# Patient Record
Sex: Male | Born: 1985 | Race: White | Hispanic: No | Marital: Single | Smoking: Current every day smoker
Health system: Southern US, Community
[De-identification: ages and names within clinical notes are randomized; demographics above are authoritative.]

---

## 2022-01-13 ENCOUNTER — Encounter (HOSPITAL_COMMUNITY): Payer: Self-pay

## 2022-01-13 ENCOUNTER — Other Ambulatory Visit: Payer: Self-pay

## 2022-01-13 ENCOUNTER — Emergency Department (HOSPITAL_COMMUNITY): Payer: Self-pay

## 2022-01-13 ENCOUNTER — Emergency Department (HOSPITAL_COMMUNITY)
Admission: EM | Admit: 2022-01-13 | Discharge: 2022-01-13 | Disposition: A | Discharge status: Home / Self Care | Payer: Self-pay | Attending: Emergency Medicine | Admitting: Emergency Medicine

## 2022-01-13 DIAGNOSIS — M25562 Pain in left knee: Secondary | ICD-10-CM | POA: Insufficient documentation

## 2022-01-13 MED ORDER — ACETAMINOPHEN 325 MG PO TABS
650.0000 mg | ORAL_TABLET | Freq: Once | ORAL | Status: AC
  Administered 2022-01-13: 650 mg via ORAL
  Filled 2022-01-13: qty 2

## 2022-01-13 NOTE — ED Triage Notes (Signed)
Pt reports left knee pain and swelling. Pt denies injury but reports he has been walking more than often. ?

## 2022-01-13 NOTE — ED Provider Triage Note (Signed)
Emergency Medicine Provider Triage Evaluation Note ? ?Keith Kirby , a 36 y.o. male  was evaluated in triage.  Pt complains of left knee pain for the past several days. 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 Last IV use 3/15. Reports worsening pain, decreased ROM, feels hot to touch. Thinks he may have strained it walking around a lot. No systemic fever, chills. No previous injury or surgery. ? ?Review of Systems  ?Positive: Knee pain, swelling ?Negative: Fever, chills ? ?Physical Exam  ?BP 103/79 (BP Location: Right Arm)   Pulse 87   Temp 98.6 ?F (37 ?C) (Oral)   Resp 18   SpO2 95%  ?Gen:   Awake, no distress   ?Resp:  Normal effort  ?MSK:   Moves extremities without difficulty  ?Other:  Some TTP left knee with minimal to no effusion. Decreased ROM 2/2 pain. No clear septic joint or cellulitis, but some warmth to touch. ? ?Medical Decision Making  ?Medically screening exam initiated at 5:25 PM.  Appropriate orders placed.  Keith Kirby was informed that the remainder of the evaluation will be completed by another provider, this initial triage assessment does not replace that evaluation, and the importance of remaining in the ED until their evaluation is complete. ? ?Workup initiated ?  ?Olene Floss, PA-C ?01/13/22 1727 ? ?

## 2022-01-13 NOTE — Discharge Instructions (Signed)
Please use Tylenol or ibuprofen for pain.  You may use 600 mg ibuprofen every 6 hours or 1000 mg of Tylenol every 6 hours.  You may choose to alternate between the 2.  This would be most effective.  Not to exceed 4 g of Tylenol within 24 hours.  Not to exceed 3200 mg ibuprofen 24 hours.  
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
## 2022-06-28 ENCOUNTER — Emergency Department (HOSPITAL_COMMUNITY): Payer: Self-pay

## 2022-06-28 ENCOUNTER — Emergency Department (HOSPITAL_COMMUNITY)
Admission: EM | Admit: 2022-06-28 | Discharge: 2022-06-28 | Disposition: A | Discharge status: Home / Self Care | Payer: Self-pay | Attending: Emergency Medicine | Admitting: Emergency Medicine

## 2022-06-28 ENCOUNTER — Encounter (HOSPITAL_COMMUNITY): Payer: Self-pay | Admitting: Emergency Medicine

## 2022-06-28 ENCOUNTER — Other Ambulatory Visit: Payer: Self-pay

## 2022-06-28 DIAGNOSIS — T402X1A Poisoning by other opioids, accidental (unintentional), initial encounter: Secondary | ICD-10-CM | POA: Insufficient documentation

## 2022-06-28 DIAGNOSIS — T40601A Poisoning by unspecified narcotics, accidental (unintentional), initial encounter: Secondary | ICD-10-CM

## 2022-06-28 DIAGNOSIS — R4182 Altered mental status, unspecified: Secondary | ICD-10-CM | POA: Insufficient documentation

## 2022-06-28 LAB — COMPREHENSIVE METABOLIC PANEL
ALT: 31 U/L (ref 0–44)
AST: 38 U/L (ref 15–41)
Albumin: 4.2 g/dL (ref 3.5–5.0)
Alkaline Phosphatase: 77 U/L (ref 38–126)
Anion gap: 6 (ref 5–15)
BUN: 17 mg/dL (ref 6–20)
CO2: 28 mmol/L (ref 22–32)
Calcium: 8.5 mg/dL — ABNORMAL LOW (ref 8.9–10.3)
Chloride: 105 mmol/L (ref 98–111)
Creatinine, Ser: 1.18 mg/dL (ref 0.61–1.24)
GFR, Estimated: >60 mL/min (ref >60)
Glucose, Bld: 113 mg/dL — ABNORMAL HIGH (ref 70–99)
Potassium: 4.5 mmol/L (ref 3.5–5.1)
Sodium: 139 mmol/L (ref 135–145)
Total Bilirubin: 0.5 mg/dL (ref 0.3–1.2)
Total Protein: 7.3 g/dL (ref 6.5–8.1)

## 2022-06-28 LAB — CBC WITH DIFFERENTIAL/PLATELET
Abs Immature Granulocytes: 0.1 10*3/uL — ABNORMAL HIGH (ref 0.00–0.07)
Basophils Absolute: 0 10*3/uL (ref 0.0–0.1)
Basophils Relative: 0 %
Eosinophils Absolute: 0 10*3/uL (ref 0.0–0.5)
Eosinophils Relative: 0 %
HCT: 46.2 % (ref 39.0–52.0)
Hemoglobin: 15.5 g/dL (ref 13.0–17.0)
Immature Granulocytes: 1 %
Lymphocytes Relative: 6 %
Lymphs Abs: 0.8 10*3/uL (ref 0.7–4.0)
MCH: 30.8 pg (ref 26.0–34.0)
MCHC: 33.5 g/dL (ref 30.0–36.0)
MCV: 91.7 fL (ref 80.0–100.0)
Monocytes Absolute: 1.1 10*3/uL — ABNORMAL HIGH (ref 0.1–1.0)
Monocytes Relative: 8 %
Neutro Abs: 10.5 10*3/uL — ABNORMAL HIGH (ref 1.7–7.7)
Neutrophils Relative %: 85 %
Platelets: 229 10*3/uL (ref 150–400)
RBC: 5.04 MIL/uL (ref 4.22–5.81)
RDW: 12.4 % (ref 11.5–15.5)
WBC: 12.6 10*3/uL — ABNORMAL HIGH (ref 4.0–10.5)
nRBC: 0 % (ref 0.0–0.2)

## 2022-06-28 LAB — RAPID URINE DRUG SCREEN, HOSP PERFORMED
Amphetamines: POSITIVE — AB
Barbiturates: NOT DETECTED
Benzodiazepines: NOT DETECTED
Cocaine: POSITIVE — AB
Opiates: NOT DETECTED
Tetrahydrocannabinol: POSITIVE — AB

## 2022-06-28 LAB — ETHANOL: Alcohol, Ethyl (B): <10 mg/dL (ref <10)

## 2022-06-28 MED ORDER — NALOXONE HCL 2 MG/2ML IJ SOSY
1.0000 mg | PREFILLED_SYRINGE | INTRAMUSCULAR | Status: DC | PRN
  Administered 2022-06-28 (×2): 1 mg via INTRAVENOUS
  Filled 2022-06-28: qty 2

## 2022-06-28 MED ORDER — NALOXONE HCL 4 MG/0.1ML NA LIQD: NASAL | 1 refills | Status: AC

## 2022-06-28 MED ORDER — SODIUM CHLORIDE 0.9 % IV BOLUS
1000.0000 mL | Freq: Once | INTRAVENOUS | Status: AC
  Administered 2022-06-28: 1000 mL via INTRAVENOUS

## 2022-06-28 NOTE — ED Notes (Signed)
Nurse tech and RN went to pt room to perform a foley when he woke up stated "The hell you won't stick that in me." Urinal given to pt. Still awaiting sample
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
## 2022-06-28 NOTE — ED Notes (Signed)
Patient awake and talking. Meal given. Ride called for them to come pick up patient. JRPRN
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
## 2022-06-28 NOTE — ED Provider Notes (Signed)
  Physical Exam  BP 115/86   Pulse 63   Temp 97.8 F (36.6 C) (Oral)   Resp 18   SpO2 100%   Physical Exam Vitals and nursing note reviewed.  Constitutional:      General: He is not in acute distress.    Appearance: Normal appearance.  HENT:     Mouth/Throat:     Mouth: Mucous membranes are moist.  Eyes:     Conjunctiva/sclera: Conjunctivae normal.  Cardiovascular:     Rate and Rhythm: Normal rate and regular rhythm.  Pulmonary:     Effort: Pulmonary effort is normal. No respiratory distress.     Breath sounds: Normal breath sounds.  Abdominal:     General: Abdomen is flat.     Palpations: Abdomen is soft.     Tenderness: There is no abdominal tenderness.  Musculoskeletal:     Right lower leg: No edema.     Left lower leg: No edema.  Skin:    General: Skin is warm and dry.     Capillary Refill: Capillary refill takes less than 2 seconds.  Neurological:     Mental Status: He is alert and oriented to person, place, and time. Mental status is at baseline.  Psychiatric:        Mood and Affect: Mood normal.        Behavior: Behavior normal.     Procedures  Procedures  ED Course / MDM   Clinical Course as of 06/28/22 2040  Tue Jun 28, 2022  2031 Received signout on patient pending reassessment after opioid overdose.  Last received Narcan over 4 hours ago.  Patient awake, alert, oriented. Will discharge patient to home. All questions answered. Patient comfortable with plan of discharge. Return precautions discussed with patient and specified on the after visit summary.  [WS]    Clinical Course User Index [WS] Lonell Grandchild, MD   Medical Decision Making Amount and/or Complexity of Data Reviewed Labs: ordered. Radiology: ordered. ECG/medicine tests: ordered.  Risk Prescription drug management.        Lonell Grandchild, MD 06/28/22 2040

## 2022-06-28 NOTE — ED Notes (Signed)
Unable to obtain temp at this time. Will try again

## 2022-07-31 ENCOUNTER — Inpatient Hospital Stay (HOSPITAL_COMMUNITY)
Admission: EM | Admit: 2022-07-31 | Discharge: 2022-08-01 | DRG: 917 | Discharge status: Against Medical Advice | Payer: Self-pay | Attending: Internal Medicine | Admitting: Internal Medicine

## 2022-07-31 ENCOUNTER — Emergency Department (HOSPITAL_COMMUNITY): Payer: Self-pay

## 2022-07-31 ENCOUNTER — Encounter (HOSPITAL_COMMUNITY): Payer: Self-pay | Admitting: Emergency Medicine

## 2022-07-31 DIAGNOSIS — Y92524 Gas station as the place of occurrence of the external cause: Secondary | ICD-10-CM

## 2022-07-31 DIAGNOSIS — F1490 Cocaine use, unspecified, uncomplicated: Secondary | ICD-10-CM | POA: Diagnosis present

## 2022-07-31 DIAGNOSIS — G928 Other toxic encephalopathy: Secondary | ICD-10-CM | POA: Diagnosis present

## 2022-07-31 DIAGNOSIS — T40601A Poisoning by unspecified narcotics, accidental (unintentional), initial encounter: Principal | ICD-10-CM | POA: Diagnosis present

## 2022-07-31 DIAGNOSIS — Z5329 Procedure and treatment not carried out because of patient's decision for other reasons: Secondary | ICD-10-CM | POA: Diagnosis present

## 2022-07-31 LAB — COMPREHENSIVE METABOLIC PANEL
ALT: 26 U/L (ref 0–44)
AST: 35 U/L (ref 15–41)
Albumin: 4.3 g/dL (ref 3.5–5.0)
Alkaline Phosphatase: 86 U/L (ref 38–126)
Anion gap: 6 (ref 5–15)
BUN: 22 mg/dL — ABNORMAL HIGH (ref 6–20)
CO2: 28 mmol/L (ref 22–32)
Calcium: 8.6 mg/dL — ABNORMAL LOW (ref 8.9–10.3)
Chloride: 106 mmol/L (ref 98–111)
Creatinine, Ser: 1.09 mg/dL (ref 0.61–1.24)
GFR, Estimated: >60 mL/min (ref >60)
Glucose, Bld: 130 mg/dL — ABNORMAL HIGH (ref 70–99)
Potassium: 4.4 mmol/L (ref 3.5–5.1)
Sodium: 140 mmol/L (ref 135–145)
Total Bilirubin: 0.8 mg/dL (ref 0.3–1.2)
Total Protein: 7.3 g/dL (ref 6.5–8.1)

## 2022-07-31 LAB — CBC WITH DIFFERENTIAL/PLATELET
Abs Immature Granulocytes: 0.03 10*3/uL (ref 0.00–0.07)
Basophils Absolute: 0.1 10*3/uL (ref 0.0–0.1)
Basophils Relative: 1 %
Eosinophils Absolute: 0.1 10*3/uL (ref 0.0–0.5)
Eosinophils Relative: 2 %
HCT: 44.6 % (ref 39.0–52.0)
Hemoglobin: 15.1 g/dL (ref 13.0–17.0)
Immature Granulocytes: 1 %
Lymphocytes Relative: 27 %
Lymphs Abs: 1.6 10*3/uL (ref 0.7–4.0)
MCH: 30.6 pg (ref 26.0–34.0)
MCHC: 33.9 g/dL (ref 30.0–36.0)
MCV: 90.5 fL (ref 80.0–100.0)
Monocytes Absolute: 0.4 10*3/uL (ref 0.1–1.0)
Monocytes Relative: 7 %
Neutro Abs: 3.8 10*3/uL (ref 1.7–7.7)
Neutrophils Relative %: 62 %
Platelets: 220 10*3/uL (ref 150–400)
RBC: 4.93 MIL/uL (ref 4.22–5.81)
RDW: 11.9 % (ref 11.5–15.5)
WBC: 6 10*3/uL (ref 4.0–10.5)
nRBC: 0 % (ref 0.0–0.2)

## 2022-07-31 LAB — ETHANOL: Alcohol, Ethyl (B): <10 mg/dL (ref <10)

## 2022-07-31 LAB — SALICYLATE LEVEL: Salicylate Lvl: <7 mg/dL — ABNORMAL LOW (ref 7.0–30.0)

## 2022-07-31 LAB — ACETAMINOPHEN LEVEL: Acetaminophen (Tylenol), Serum: <10 ug/mL — ABNORMAL LOW (ref 10–30)

## 2022-07-31 LAB — CBG MONITORING, ED: Glucose-Capillary: 123 mg/dL — ABNORMAL HIGH (ref 70–99)

## 2022-07-31 MED ORDER — NALOXONE HCL 2 MG/2ML IJ SOSY
1.0000 mg | PREFILLED_SYRINGE | Freq: Once | INTRAMUSCULAR | Status: AC
  Administered 2022-07-31: 1 mg via INTRAVENOUS
  Filled 2022-07-31: qty 2

## 2022-07-31 MED ORDER — NALOXONE HCL 2 MG/2ML IJ SOSY
2.0000 mg | PREFILLED_SYRINGE | Freq: Once | INTRAMUSCULAR | Status: AC
  Administered 2022-07-31: 2 mg via INTRAVENOUS
  Filled 2022-07-31: qty 2

## 2022-07-31 MED ORDER — SODIUM CHLORIDE 0.9 % IV BOLUS
1000.0000 mL | Freq: Once | INTRAVENOUS | Status: AC
  Administered 2022-07-31: 1000 mL via INTRAVENOUS

## 2022-07-31 MED ORDER — NALOXONE HCL 0.4 MG/ML IJ SOLN
INTRAMUSCULAR | Status: AC
  Filled 2022-07-31: qty 1

## 2022-07-31 MED ORDER — NALOXONE HCL 2 MG/2ML IJ SOSY
2.0000 mg | PREFILLED_SYRINGE | Freq: Once | INTRAMUSCULAR | Status: AC
  Administered 2022-08-01: 2 mg via INTRAVENOUS
  Filled 2022-07-31: qty 2

## 2022-07-31 MED ORDER — NALOXONE HCL 4 MG/10ML IJ SOLN
3.0000 mg/h | INTRAVENOUS | Status: DC
  Administered 2022-08-01: 3 mg/h via INTRAVENOUS
  Filled 2022-07-31 (×3): qty 10

## 2022-07-31 MED ORDER — NALOXONE HCL 0.4 MG/ML IJ SOLN
0.4000 mg | Freq: Once | INTRAMUSCULAR | Status: AC
  Administered 2022-07-31: 0.4 mg via INTRAVENOUS
  Filled 2022-07-31: qty 1

## 2022-07-31 MED ORDER — ONDANSETRON HCL 4 MG/2ML IJ SOLN
4.0000 mg | Freq: Once | INTRAMUSCULAR | Status: AC
  Administered 2022-07-31: 4 mg via INTRAVENOUS
  Filled 2022-07-31: qty 2
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
## 2022-07-31 NOTE — ED Notes (Signed)
Pt refusing all care from staff. End [place] CO2 [place] applied to nose and pt removed it saying "I don't need any care, I've been refusing and ya'll continue to bother me" This RN will continue to wake pt for increased O2 sats adm vital signs

## 2022-07-31 NOTE — H&P (Signed)
NAME:  Keith Kirby, MRN:  [id number], DOB:  1986-02-05, LOS: 0 ADMISSION DATE:  07/31/2022, CONSULTATION DATE:  10/8 REFERRING MD:  Dr. Pearline Cables, CHIEF COMPLAINT:  overdose   History of Present Illness:  Patient is a 36 year old male with pertinent PMH of polysubstance abuse presents to [facility] ED on 10/8 for overdose.  Patient recently admitted to [facility] ED in September with positive UDS with cocaine, amphetamines, THC.  Per EMS patient was found in a parking lot on 10/8 at [employer].  Found patient unresponsive and apneic.  Required BVM and Narcan with improvement in mental status.  Transferred to [facility] ED.  Upon arrival to [facility] ED patient more awake and responsive.  Patient states that he did take THC and cocaine.  Denies opiate use but states that he met his symptoms and fentanyl.  Denies alcohol use.  Refuses a urine sample for UDS.  Vital stable with decreased respiratory rate.  Requiring multiple doses of Narcan and was started on Narcan drip.  PCCM consulted for ICU admission.   Pertinent ED labs/imaging: Ethanol, acetaminophen, salicylate level WNL.  CT head and CXR with no acute abnormality.  Upon eval pt is arousable easily and requesting to leave. Stating he will pull out IV and just leave. I encouraged him to remain here and we can begin wean of gtt safely to ensure resp status remains stable.  His only complaint is the fact that he is in the hospital and wants to get home to his "lady" in addition to nausea and wanting to drink. He denies opioid use and states he doesn't need our help and will be pulling out his iv's because he's getting sick from the medications we are giving.   Pertinent  Medical History  Polysubstance abuse  Significant Hospital Events: Including procedures, antibiotic start and stop dates in addition to other pertinent events   10/8 overdose started on Narcan drip; PCCM consulted  Interim History / Subjective:  See above  Objective   Blood pressure  129/87, pulse 80, temperature 98.7 F (37.1 C), temperature source Oral, resp. rate (!) 8, SpO2 93 %.        Intake/Output Summary (Last 24 hours) at 07/31/2022 2345 Last data filed at 07/31/2022 1932 Gross per 24 hour  Intake 466.2 ml  Output --  Net 466.2 ml   There were no vitals filed for this visit.  Examination: General: arousable, aaox3, nad resting comfortably HENT: ncat, eomi, perrla, mmmp, tongue ring in place Lungs: ctab Cardiovascular: rrr Abdomen: soft nt/nd bs+ Extremities: no c/c/e Neuro: sleepy but arousable, no focal deficits.  GU: deferred.   Resolved Hospital Problem list     Assessment & Plan:   OD- admits to taking cocaine, THC, possible fentanyl; currently refusing UDS Polysubstance abuse Acute encephalopathy due to above -Ethanol, acetaminophen, salicylate level WNL -CT head with no acute abnormality P: -Admit to ICU with continuous telemetry monitoring -Continue Narcan drip but begin wean at this time with neuro exam stable -Send UDS when able -prn zofran for nausea -limit sedating meds -TOC consult for cessation counseling when appropriate -Closely monitor respiratory status; currently on room air protecting airway   Best Practice (right click and "Reselect all SmartList Selections" daily)   Diet/type: NPO DVT prophylaxis: SCD GI prophylaxis: N/A Lines: N/A Foley:  N/A Code Status:  full code Last date of multidisciplinary goals of care discussion [pending more calm discussion, will place full order]  Labs   CBC: Recent Labs  Lab 07/31/22  1844  WBC 6.0  NEUTROABS 3.8  HGB 15.1  HCT 44.6  MCV 90.5  PLT 154    Basic Metabolic Panel: Recent Labs  Lab 07/31/22 1844  NA 140  K 4.4  CL 106  CO2 28  GLUCOSE 130*  BUN 22*  CREATININE 1.09  CALCIUM 8.6*   GFR: CrCl cannot be calculated (Unknown ideal weight.). Recent Labs  Lab 07/31/22 1844  WBC 6.0    Liver Function Tests: Recent Labs  Lab 07/31/22 1844  AST 35   ALT 26  ALKPHOS 86  BILITOT 0.8  PROT 7.3  ALBUMIN 4.3   No results for input(s): "LIPASE", "AMYLASE" in the last 168 hours. No results for input(s): "AMMONIA" in the last 168 hours.  ABG No results found for: "PHART", "PCO2ART", "PO2ART", "HCO3", "TCO2", "ACIDBASEDEF", "O2SAT"   Coagulation Profile: No results for input(s): "INR", "PROTIME" in the last 168 hours.  Cardiac Enzymes: No results for input(s): "CKTOTAL", "CKMB", "CKMBINDEX", "TROPONINI" in the last 168 hours.  HbA1C: No results found for: "HGBA1C"  CBG: Recent Labs  Lab 07/31/22 1912  GLUCAP 123*    Review of Systems:   As per HPI  Past Medical History:  He,  has no past medical history on file.   Surgical History:  History reviewed. No pertinent surgical history.   Social History:      Family History:  His family history is not on file.   Allergies No Known Allergies   Home Medications  Prior to Admission medications   Medication Sig Start Date End Date Taking? Authorizing Provider  naloxone [facility]) nasal spray 4 mg/0.1 mL Use as needed in the nose for opioid overdose 06/28/22  Yes Cristie Hem, MD     Critical care time: 82mn excluding procedures.

## 2022-07-31 NOTE — ED Provider Notes (Signed)
[facility] DEPT Provider Note   CSN: [id number] Arrival date & time: 07/31/22  1830     History  Chief Complaint  Patient presents with   Drug Overdose    Keith Kirby is a 36 y.o. male.  Patient as above with significant medical history as below, including n polysubstance abuse who presents to the ED with complaint of possible overdose.  Per EMS she was found in the parking lot at the [place] [employer] station, apneic, pale, unresponsive, BVM for 4 to 5 minutes.  Given 2 mg Narcan IV with improvement to respiratory mental status.  Patient denies intentional harm.  Reports he was using cocaine, denies any opiate use.  But possible someone may have slipped him something, maybe fentanyl.  Thinks he may have had some THC earlier as well. No alcohol use.  Reports that he is thirsty, no nausea or vomiting, no dyspnea.  Reports she has been riding his bicycle all day and he is exhausted, he later then denies any sort of overdose or drug use. Reports last tetanus shot was <1 yr ago. No SI or HI      History reviewed. No pertinent past medical history.  History reviewed. No pertinent surgical history.   The history is provided by the patient. No language interpreter was used.  Drug Overdose       Home Medications Prior to Admission medications   Medication Sig Start Date End Date Taking? Authorizing Provider  naloxone [facility]) nasal spray 4 mg/0.1 mL Use as needed in the nose for opioid overdose 06/28/22  Yes Scheving, Livingston Diones, MD      Allergies    Patient has no known allergies.    Review of Systems   Review of Systems  Unable to perform ROS: Mental status change    Physical Exam Updated Vital Signs BP 129/87   Pulse 80   Temp 98.7 F (37.1 C) (Oral)   Resp (!) 8   SpO2 93%  Physical Exam Vitals and nursing note reviewed.  Constitutional:      General: He is not in acute distress.    Appearance: Normal appearance. He is well-developed.   HENT:     Head: Normocephalic and atraumatic.     Right Ear: External ear normal.     Left Ear: External ear normal.     Mouth/Throat:     Mouth: Mucous membranes are moist.  Eyes:     General: No scleral icterus.    Comments: Pupils constricted b/l  Cardiovascular:     Rate and Rhythm: Normal rate and regular rhythm.     Pulses: Normal pulses.     Heart sounds: Normal heart sounds.  Pulmonary:     Effort: Pulmonary effort is normal. Bradypnea present. No respiratory distress.     Breath sounds: Normal breath sounds.  Abdominal:     General: Abdomen is flat.     Palpations: Abdomen is soft.     Tenderness: There is no abdominal tenderness.  Musculoskeletal:        General: Normal range of motion.     Cervical back: Normal range of motion.     Right lower leg: No edema.     Left lower leg: No edema.  Skin:    General: Skin is warm and dry.     Capillary Refill: Capillary refill takes less than 2 seconds.  Neurological:     Mental Status: He is alert and oriented to person, place, and time.  Psychiatric:        Mood and Affect: Mood normal.        Behavior: Behavior normal.     ED Results / Procedures / Treatments   Labs (all labs ordered are listed, but only abnormal results are displayed) Labs Reviewed  COMPREHENSIVE METABOLIC PANEL - Abnormal; Notable for the following components:      Result Value   Glucose, Bld 130 (*)    BUN 22 (*)    Calcium 8.6 (*)    All other components within normal limits  SALICYLATE LEVEL - Abnormal; Notable for the following components:   Salicylate Lvl <7.0 (*)    All other components within normal limits  ACETAMINOPHEN LEVEL - Abnormal; Notable for the following components:   Acetaminophen (Tylenol), Serum <10 (*)    All other components within normal limits  CBG MONITORING, ED - Abnormal; Notable for the following components:   Glucose-Capillary 123 (*)    All other components within normal limits  ETHANOL  CBC WITH  DIFFERENTIAL/PLATELET  RAPID URINE DRUG SCREEN, HOSP PERFORMED    EKG EKG Interpretation  Date/Time:  Sunday July 31 2022 19:06:41 EDT Ventricular Rate:  93 PR Interval:  152 QRS Duration: 100 QT Interval:  371 QTC Calculation: 462 R Axis:   119 Text Interpretation: Sinus rhythm Left atrial enlargement Consider right ventricular hypertrophy Baseline wander in lead(s) V2 no stemi Confirmed by Tanda Rockers ([id number]) on 07/31/2022 11:33:27 PM  Radiology CT Head Wo Contrast  Result Date: 07/31/2022 CLINICAL DATA:  Delirium, overdose. EXAM: CT HEAD WITHOUT CONTRAST TECHNIQUE: Contiguous axial images were obtained from the base of the skull through the vertex without intravenous contrast. RADIATION DOSE REDUCTION: This exam was performed according to the departmental dose-optimization program which includes automated exposure control, adjustment of the mA and/or kV according to patient size and/or use of iterative reconstruction technique. COMPARISON:  CT examination dated June 28, 2022 FINDINGS: Brain: No evidence of acute infarction, hemorrhage, hydrocephalus, extra-axial collection or mass lesion/mass effect. Vascular: No hyperdense vessel or unexpected calcification. Skull: Normal. Negative for fracture or focal lesion. Sinuses/Orbits: No acute finding. Other: None. IMPRESSION: No acute intracranial pathology. Electronically Signed   By: Larose Hires D.O.   On: 07/31/2022 21:52   DG Chest Portable 1 View  Result Date: 07/31/2022 CLINICAL DATA:  Overdose EXAM: PORTABLE CHEST 1 VIEW COMPARISON:  Portable exam 1856 hours without priors for comparison FINDINGS: Normal heart size, mediastinal contours, and pulmonary vascularity. Lungs clear. No infiltrate, pleural effusion, or pneumothorax. Osseous structures unremarkable. IMPRESSION: No acute abnormalities. Electronically Signed   By: Ulyses Southward M.D.   On: 07/31/2022 19:14    Procedures .Critical Care  Performed by: Sloan Leiter,  DO Authorized by: Sloan Leiter, DO   Critical care provider statement:    Critical care time (minutes):  99   Critical care time was exclusive of:  Separately billable procedures and treating other patients   Critical care was necessary to treat or prevent imminent or life-threatening deterioration of the following conditions:  Toxidrome   Critical care was time spent personally by me on the following activities:  Development of treatment plan with patient or surrogate, discussions with consultants, evaluation of patient's response to treatment, examination of patient, ordering and review of laboratory studies, ordering and review of radiographic studies, ordering and performing treatments and interventions, pulse oximetry, re-evaluation of patient's condition, review of old charts and obtaining history from patient or surrogate   Care discussed with: admitting provider  Medications Ordered in ED Medications  naloxone HCl (NARCAN) 4 mg in dextrose 5 % 250 mL infusion (has no administration in time range)  naloxone [facility]) injection 2 mg (has no administration in time range)  naloxone [facility]) injection 0.4 mg (0.4 mg Intravenous Given 07/31/22 1855)  ondansetron (ZOFRAN) injection 4 mg (4 mg Intravenous Given 07/31/22 1855)  sodium chloride 0.9 % bolus 1,000 mL (0 mLs Intravenous Stopped 07/31/22 1932)  naloxone (NARCAN) 0.4 MG/ML injection (  Given 07/31/22 1915)  naloxone [facility]) injection 1 mg (1 mg Intravenous Given 07/31/22 1943)  naloxone [facility]) injection 1 mg (1 mg Intravenous Given 07/31/22 2028)  naloxone [facility]) injection 2 mg (2 mg Intravenous Given 07/31/22 2126)    ED Course/ Medical Decision Making/ A&P Clinical Course as of 07/31/22 2339  Sun Jul 31, 2022  1912 Patient apneic, hypoxic, given repeat dose of Narcan 0.4 mg IV with improvement to respiratory mental status. [SG]    Clinical Course User Index [SG] Sloan Leiter, DO                           Medical  Decision Making Amount and/or Complexity of Data Reviewed Labs: ordered. Radiology: ordered.  Risk Prescription drug management. Decision regarding hospitalization.   This patient presents to the ED with chief complaint(s) of overdose with pertinent past medical history of polysubstance abuse which further complicates the presenting complaint. The complaint involves an extensive differential diagnosis and also carries with it a high risk of complications and morbidity.    The differential diagnosis includes but not limited to   Differential diagnoses for altered mental status includes but is not exclusive to alcohol, illicit or prescription medications, intracranial pathology such as stroke, intracerebral hemorrhage, fever or infectious causes including sepsis, hypoxemia, uremia, trauma, endocrine related disorders such as diabetes, hypoglycemia, thyroid-related diseases, etc.  . Serious etiologies were considered.   The initial plan is to give Narcan, screening labs and imaging, IV fluids   Additional history obtained: Additional history obtained from EMS /pd Records reviewed  prior ED visits, prior labs and imaging Prior UDS with cocaine, amphetamines, thc  Independent labs interpretation:  The following labs were independently interpreted:  Salicylate, significant ethanol levels negative.  CMP and CBC were stable.  Independent visualization of imaging: - I independently visualized the following imaging with scope of interpretation limited to determining acute life threatening conditions related to emergency care: CT head, chest x-ray, which revealed no acute findings  Cardiac monitoring was reviewed and interpreted by myself which shows NSR  Treatment and Reassessment: Patient given multiple doses of Narcan intravenously, IV fluids, Zofran. he continues to desaturate, sleepy, hypoxic.  Transient improvement following Narcan.  Will start Narcan drip.  Consultation: -  Consulted or discussed management/test interpretation w/ external professional: pccm  Consideration for admission or further workup: Admission was considered   Patient with apparent accidental opiate overdose. Positive response with narcan although short lived. Will start narcan infusion.  Work-up today is otherwise reassuring.  Patient requiring Narcan infusion. Denies SI.  Admit to PCCM. Spoke with PCCM who accepts pt for admission.     Social Determinants of health:   Polysubstance abuse No pcp         Final Clinical Impression(s) / ED Diagnoses Final diagnoses:  Opiate overdose, accidental or unintentional, initial encounter [facility])    Rx / DC Orders ED Discharge Orders     None  Sloan Leiter, DO 07/31/22 2339

## 2022-08-01 ENCOUNTER — Encounter (HOSPITAL_COMMUNITY): Payer: Self-pay | Admitting: Critical Care Medicine

## 2022-08-01 ENCOUNTER — Other Ambulatory Visit: Payer: Self-pay

## 2022-08-01 DIAGNOSIS — T40601D Poisoning by unspecified narcotics, accidental (unintentional), subsequent encounter: Secondary | ICD-10-CM

## 2022-08-01 DIAGNOSIS — T40601A Poisoning by unspecified narcotics, accidental (unintentional), initial encounter: Secondary | ICD-10-CM | POA: Diagnosis present

## 2022-08-01 LAB — BASIC METABOLIC PANEL
Anion gap: 4 — ABNORMAL LOW (ref 5–15)
BUN: 20 mg/dL (ref 6–20)
CO2: 27 mmol/L (ref 22–32)
Calcium: 8.5 mg/dL — ABNORMAL LOW (ref 8.9–10.3)
Chloride: 104 mmol/L (ref 98–111)
Creatinine, Ser: 0.8 mg/dL (ref 0.61–1.24)
GFR, Estimated: >60 mL/min (ref >60)
Glucose, Bld: 172 mg/dL — ABNORMAL HIGH (ref 70–99)
Potassium: 4.1 mmol/L (ref 3.5–5.1)
Sodium: 135 mmol/L (ref 135–145)

## 2022-08-01 LAB — MRSA NEXT GEN BY PCR, NASAL: MRSA by PCR Next Gen: NOT DETECTED

## 2022-08-01 LAB — CBC
HCT: 39.9 % (ref 39.0–52.0)
Hemoglobin: 13.5 g/dL (ref 13.0–17.0)
MCH: 30.9 pg (ref 26.0–34.0)
MCHC: 33.8 g/dL (ref 30.0–36.0)
MCV: 91.3 fL (ref 80.0–100.0)
Platelets: 174 10*3/uL (ref 150–400)
RBC: 4.37 MIL/uL (ref 4.22–5.81)
RDW: 11.9 % (ref 11.5–15.5)
WBC: 8.2 10*3/uL (ref 4.0–10.5)
nRBC: 0 % (ref 0.0–0.2)

## 2022-08-01 LAB — MAGNESIUM: Magnesium: 2.2 mg/dL (ref 1.7–2.4)

## 2022-08-01 LAB — HIV ANTIBODY (ROUTINE TESTING W REFLEX): HIV Screen 4th Generation wRfx: NONREACTIVE

## 2022-08-01 MED ORDER — ORAL CARE MOUTH RINSE: 15.0000 mL | OROMUCOSAL | Status: DC | PRN

## 2022-08-01 MED ORDER — DOCUSATE SODIUM 100 MG PO CAPS: 100.0000 mg | ORAL_CAPSULE | Freq: Two times a day (BID) | ORAL | Status: DC | PRN

## 2022-08-01 MED ORDER — CHLORHEXIDINE GLUCONATE CLOTH 2 % EX PADS: 6.0000 | MEDICATED_PAD | Freq: Every day | CUTANEOUS | Status: DC

## 2022-08-01 MED ORDER — ONDANSETRON HCL 4 MG/2ML IJ SOLN
4.0000 mg | Freq: Four times a day (QID) | INTRAMUSCULAR | Status: DC | PRN
  Administered 2022-08-01: 4 mg via INTRAVENOUS
  Filled 2022-08-01: qty 2

## 2022-08-01 MED ORDER — POLYETHYLENE GLYCOL 3350 17 G PO PACK: 17.0000 g | PACK | Freq: Every day | ORAL | Status: DC | PRN

## 2022-08-01 MED ORDER — ENOXAPARIN SODIUM 40 MG/0.4ML IJ SOSY: 40.0000 mg | PREFILLED_SYRINGE | INTRAMUSCULAR | Status: DC

## 2022-08-01 MED ORDER — CHLORHEXIDINE GLUCONATE CLOTH 2 % EX PADS
6.0000 | MEDICATED_PAD | Freq: Every day | CUTANEOUS | Status: DC
  Administered 2022-08-01: 6 via TOPICAL

## 2022-08-01 MED ORDER — SODIUM CHLORIDE 0.9 % IV SOLN: INTRAVENOUS | Status: DC

## 2022-08-01 MED ORDER — ORAL CARE MOUTH RINSE: 15.0000 mL | OROMUCOSAL | Status: DC

## 2022-08-01 NOTE — Progress Notes (Addendum)
Pt arrived to 1233 with Narcan gtt. Pt is aggressive and upset because a second bag of Narcan has been initiated while he was in the ED. Pt is threatening to leave and "rip out Narcan gtt". CCM paged and gave verbal orders to stop Narcan gtt d/t pt being awake and VSS.

## 2022-08-01 NOTE — Progress Notes (Signed)
[facility] Progress Note Patient Name: Keith Kirby DOB: 11-Apr-1986 MRN: [id number]   Date of Service  08/01/2022  HPI/Events of Note  Notified of agitation.  Pt is a 36/M brought in due to drug overdose.  He was placed on narcan gtt.  He was getting upset that a second bag of narcan gtt is being started and making him nauseated.    On camera assessment, pt is awake and alert.  He reported taking cocaine only.  He is speaking in full sentences.  RR 20, BP 113/87, HR 72.  eICU Interventions  Ok to discontinue narcan at this point.   Explained to the patient to give [place] time to reassess and determine when he is ready to leave the hospital.  He was agreeable to the plan.       Intervention Category Intermediate Interventions: Other:  Elsie Lincoln 08/01/2022, 4:56 AM

## 2022-08-01 NOTE — Progress Notes (Signed)
NAME:  Keith Kirby, MRN:  [id number], DOB:  1986/04/12, LOS: 0 ADMISSION DATE:  07/31/2022, CONSULTATION DATE:  10/8 REFERRING MD:  Dr. Pearline Cables, CHIEF COMPLAINT:  overdose   BRIEF  Patient is a 36 year old male with pertinent PMH of polysubstance abuse presents to [facility] ED on 10/8 for overdose.  Patient recently admitted to [facility] ED in September with positive UDS with cocaine, amphetamines, THC.  Per EMS patient was found in a parking lot on 10/8 at [employer].  Found patient unresponsive and apneic.  Required BVM and Narcan with improvement in mental status.  Transferred to [facility] ED.  Upon arrival to [facility] ED patient more awake and responsive.  Patient states that he did take THC and cocaine.  Denies opiate use but states that he met his symptoms and fentanyl.  Denies alcohol use.  Refuses a urine sample for UDS.  Vital stable with decreased respiratory rate.  Requiring multiple doses of Narcan and was started on Narcan drip.  PCCM consulted for ICU admission.   Pertinent ED labs/imaging: Ethanol, acetaminophen, salicylate level WNL.  CT head and CXR with no acute abnormality.  Upon eval pt is arousable easily and requesting to leave. Stating he will pull out IV and just leave. I encouraged him to remain here and we can begin wean of gtt safely to ensure resp status remains stable.  His only complaint is the fact that he is in the hospital and wants to get home to his "lady" in addition to nausea and wanting to drink. He denies opioid use and states he doesn't need our help and will be pulling out his iv's because he's getting sick from the medications we are giving.   Pertinent  Medical History  Polysubstance abuse   Latest Reference Range & Units 06/28/22 18:18  Amphetamines NONE DETECTED  POSITIVE !  Barbiturates NONE DETECTED  NONE DETECTED  Benzodiazepines NONE DETECTED  NONE DETECTED  Opiates NONE DETECTED  NONE DETECTED  COCAINE NONE DETECTED  POSITIVE !  Tetrahydrocannabinol  NONE DETECTED  POSITIVE !  !: Data is abnormal  Significant Hospital Events: Including procedures, antibiotic start and stop dates in addition to other pertinent events   10/8 overdose started on Narcan drip; PCCM consulted  Interim History / Subjective:   10/9 - on room air, Off narcan gtt x 4h approx  Afrbrile. Normal wbc MAP 77.  AWake. Oriented. Able to recall info well. Wats to sign out AMA after he eats brreakfast  Objective   Blood pressure (!) 89/57, pulse 71, temperature 97.7 F (36.5 C), temperature source Oral, resp. rate 14, height 5' 10"  (1.778 m), weight 71.7 kg, SpO2 100 %.        Intake/Output Summary (Last 24 hours) at 08/01/2022 0821 Last data filed at 08/01/2022 0800 Gross per 24 hour  Intake 1371.15 ml  Output --  Net 1371.15 ml   Filed Weights   08/01/22 0430  Weight: 71.7 kg   General Appearance:  Looks stable. In Bed. No disterss Head:  Normocephalic, without obvious abnormality, atraumatic Eyes:  PERRL - yes, conjunctiva/corneas - muddy     Ears:  Normal external ear canals, both ears Nose:  G tube - no Throat:  ETT TUBE - no , OG tube - no Neck:  Supple,  No enlargement/tenderness/nodules Lungs: Clear to auscultation bilaterally Heart:  S1 and S2 normal, no murmur, CVP - no.  Pressors - no Abdomen:  Soft, no masses, no organomegaly Genitalia / Rectal:  Not  done Extremities:  Extremities- intact Skin:  ntact in exposed areas . Sacral area - not examined Neurologic:  Sedation - none -> RASS - +1 . Moves all 4s - yes. CAM-ICU - neg . Orientation - x3+     Resolved Hospital Problem list     Assessment & Plan:   OD- admits to taking cocaine, THC, possible fentanyl; currently refusing UDS Polysubstance abuse Acute encephalopathy due to above -Ethanol, acetaminophen, salicylate level WNL -CT head with no acute abnormality  08/01/2022 -off narcan. AWake and oriented. Wants go home immediately after breakfast  P: - await UDS - ambulate in  hallway - can eat - check orthostats - off IV fluids  - ideally needs another 12-24h of obsrevation but if he wants to go sooner ( he has capacity) - > dc against medical advice 08/01/2022   Best Practice (right click and "Reselect all SmartList Selections" daily)   Diet/type: regular diet DVT prophylaxis: SCD GI prophylaxis: N/A Lines: N/A Foley:  N/A Code Status:  full code Last date of multidisciplinary goals of care discussion - directly with patient   Interdisciplinary Goals of Care Family Meeting   Date carried out: 08/01/2022  Location of the meeting: Bedside  Member's involved: Physician, Bedside Registered Nurse, and Other: patient  Durable Power of Attorney or acting medical decision maker: patient    Discussion: We discussed goals of care for [employer] .  patient  Code status: Full Code  Disposition: Continue current acute care but will sign out AMA  Time spent for the meeting: 10 min         ATTESTATION & SIGNATURE     Dr. Brand Males, M.D., [facility].C.P Pulmonary and Critical Care Medicine Medical Director - [facility] ICU Staff Physician, [place] [facility] Pager: [phone], If no answer or between  15:00h - 7:00h: call [phone]  08/01/2022 8:22 AM    LABS    PULMONARY No results for input(s): "PHART", "PCO2ART", "PO2ART", "HCO3", "TCO2", "O2SAT" in the last 168 hours.  Invalid input(s): "PCO2", "PO2"  CBC Recent Labs  Lab 07/31/22 1844 08/01/22 0122  HGB 15.1 13.5  HCT 44.6 39.9  WBC 6.0 8.2  PLT 220 174    COAGULATION No results for input(s): "INR" in the last 168 hours.  CARDIAC  No results for input(s): "TROPONINI" in the last 168 hours. No results for input(s): "PROBNP" in the last 168 hours.   CHEMISTRY Recent Labs  Lab 07/31/22 1844 08/01/22 0122  NA 140 135  K 4.4 4.1  CL 106 104  CO2 28 27  GLUCOSE 130* 172*  BUN 22* 20  CREATININE 1.09  0.80  CALCIUM 8.6* 8.5*  MG  --  2.2   Estimated Creatinine Clearance: 129.5 mL/min (by C-G formula based on SCr of 0.8 mg/dL).   LIVER Recent Labs  Lab 07/31/22 1844  AST 35  ALT 26  ALKPHOS 86  BILITOT 0.8  PROT 7.3  ALBUMIN 4.3     INFECTIOUS No results for input(s): "LATICACIDVEN", "PROCALCITON" in the last 168 hours.   ENDOCRINE CBG (last 3)  Recent Labs    07/31/22 1912  GLUCAP 123*         IMAGING x48h  - image(s) personally visualized  -   highlighted in bold CT Head Wo Contrast  Result Date: 07/31/2022 CLINICAL DATA:  Delirium, overdose. EXAM: CT HEAD WITHOUT CONTRAST TECHNIQUE: Contiguous axial images were obtained from the  base of the skull through the vertex without intravenous contrast. RADIATION DOSE REDUCTION: This exam was performed according to the departmental dose-optimization program which includes automated exposure control, adjustment of the mA and/or kV according to patient size and/or use of iterative reconstruction technique. COMPARISON:  CT examination dated June 28, 2022 FINDINGS: Brain: No evidence of acute infarction, hemorrhage, hydrocephalus, extra-axial collection or mass lesion/mass effect. Vascular: No hyperdense vessel or unexpected calcification. Skull: Normal. Negative for fracture or focal lesion. Sinuses/Orbits: No acute finding. Other: None. IMPRESSION: No acute intracranial pathology. Electronically Signed   By: Keane Police D.O.   On: 07/31/2022 21:52   DG Chest Portable 1 View  Result Date: 07/31/2022 CLINICAL DATA:  Overdose EXAM: PORTABLE CHEST 1 VIEW COMPARISON:  Portable exam 1856 hours without priors for comparison FINDINGS: Normal heart size, mediastinal contours, and pulmonary vascularity. Lungs clear. No infiltrate, pleural effusion, or pneumothorax. Osseous structures unremarkable. IMPRESSION: No acute abnormalities. Electronically Signed   By: Lavonia Dana M.D.   On: 07/31/2022 19:14

## 2022-08-01 NOTE — Progress Notes (Signed)
Patient adamant on leaving, orthostatic vitals completed, vitals remain stable. Patient ambulated in hallway, no complaints of SHOB, dizziness. Patient observed and accompanied, steady on feet. Patient educated and requested to stay for further treatment but refuses. AMA paperwork signed, IV has been removed.

## 2022-08-01 NOTE — Plan of Care (Signed)
Discussed with patient plans for the shift, pain management, antimeric medications and admission procedures with some teach back displayed.  Patient is very irritable and upset and will ask remaining admission questions when calmer.  Two people need to be in the room since computer has RN/NT trapped in the corner of the room to perform scanning/charting task at this time.  Problem: Education: Goal: Knowledge of General Education information will improve Description: Including pain rating scale, medication(s)/side effects and non-pharmacologic comfort measures Outcome: Progressing   Problem: Health Behavior/Discharge Planning: Goal: Ability to manage health-related needs will improve Outcome: Not Progressing

## 2022-08-07 NOTE — Discharge Summary (Signed)
DISCHARGE SUMMARY    Date of admit: 07/31/2022  6:35 PM Date of discharge: 08/01/2022 10:00 AM Length of Stay: 1 days  PCP is Pcp, No     PROBLEM LIST Principal Problem:   Opiate overdose ([place])    Present on Admission Present on Admission:  Opiate overdose [facility])   Active Problems Principal Problem:   Opiate overdose [facility])   Resolved Problems Active Hospital Problems   Diagnosis Date Noted   Opiate overdose ([place]) 08/01/2022    Resolved Hospital Problems  No resolved problems to display.     Comprehensive Problem List Patient Active Problem List   Diagnosis Date Noted   Opiate overdose ([place]) 08/01/2022      SUMMARY Monterius Rolf Calvo was 36 y.o. patient with    has no past medical history on file.   has no past surgical history on file.   Admitted on 07/31/2022 with overdose. On narcan drip and then came off it and was stable. He had capacity and signed out [facility]     SIGNATURE    Dr. Brand Males, M.D., F.C.C.P,  Pulmonary and Critical Care Medicine Staff Physician, [place] Director - Interstitial Lung Disease  Program  Medical Director - [place] ICU [facility] [place] at [place], [place], [address]   Pager: [phone], If no answer  -[employer] or [place] [phone] Telephone (clinical office): [phone] Telephone (research): [phone]  1:54 PM 08/07/2022                  SIGNED Dr. Brand Males, M.D., F.C.C.P Pulmonary and Critical Care Medicine Staff Physician [place] [facility] Pager: [phone], If no answer or between  15:00h - 7:00h: call [phone]  08/07/2022 1:53 PM

## 2023-04-29 ENCOUNTER — Emergency Department (HOSPITAL_COMMUNITY)
Admission: EM | Admit: 2023-04-29 | Discharge: 2023-04-29 | Disposition: A | Discharge status: Home / Self Care | Payer: Self-pay | Attending: Emergency Medicine | Admitting: Emergency Medicine

## 2023-04-29 ENCOUNTER — Emergency Department (HOSPITAL_COMMUNITY): Payer: Self-pay

## 2023-04-29 DIAGNOSIS — M79632 Pain in left forearm: Secondary | ICD-10-CM | POA: Insufficient documentation

## 2023-04-29 DIAGNOSIS — S0003XA Contusion of scalp, initial encounter: Secondary | ICD-10-CM | POA: Insufficient documentation

## 2023-04-29 DIAGNOSIS — Z189 Retained foreign body fragments, unspecified material: Secondary | ICD-10-CM | POA: Insufficient documentation

## 2023-04-29 DIAGNOSIS — Z23 Encounter for immunization: Secondary | ICD-10-CM | POA: Insufficient documentation

## 2023-04-29 LAB — COMPREHENSIVE METABOLIC PANEL
ALT: 104 U/L — ABNORMAL HIGH (ref 0–44)
AST: 72 U/L — ABNORMAL HIGH (ref 15–41)
Albumin: 4 g/dL (ref 3.5–5.0)
Alkaline Phosphatase: 83 U/L (ref 38–126)
Anion gap: 8 (ref 5–15)
BUN: 21 mg/dL — ABNORMAL HIGH (ref 6–20)
CO2: 23 mmol/L (ref 22–32)
Calcium: 8.5 mg/dL — ABNORMAL LOW (ref 8.9–10.3)
Chloride: 102 mmol/L (ref 98–111)
Creatinine, Ser: 1.13 mg/dL (ref 0.61–1.24)
GFR, Estimated: >60 mL/min (ref >60)
Glucose, Bld: 110 mg/dL — ABNORMAL HIGH (ref 70–99)
Potassium: 3.7 mmol/L (ref 3.5–5.1)
Sodium: 133 mmol/L — ABNORMAL LOW (ref 135–145)
Total Bilirubin: 1.3 mg/dL — ABNORMAL HIGH (ref 0.3–1.2)
Total Protein: 6.7 g/dL (ref 6.5–8.1)

## 2023-04-29 LAB — CBC WITH DIFFERENTIAL/PLATELET
Abs Immature Granulocytes: 0.04 10*3/uL (ref 0.00–0.07)
Basophils Absolute: 0.1 10*3/uL (ref 0.0–0.1)
Basophils Relative: 0 %
Eosinophils Absolute: 0 10*3/uL (ref 0.0–0.5)
Eosinophils Relative: 0 %
HCT: 40.2 % (ref 39.0–52.0)
Hemoglobin: 13.5 g/dL (ref 13.0–17.0)
Immature Granulocytes: 0 %
Lymphocytes Relative: 16 %
Lymphs Abs: 2.3 10*3/uL (ref 0.7–4.0)
MCH: 29.7 pg (ref 26.0–34.0)
MCHC: 33.6 g/dL (ref 30.0–36.0)
MCV: 88.4 fL (ref 80.0–100.0)
Monocytes Absolute: 1.2 10*3/uL — ABNORMAL HIGH (ref 0.1–1.0)
Monocytes Relative: 8 %
Neutro Abs: 11.3 10*3/uL — ABNORMAL HIGH (ref 1.7–7.7)
Neutrophils Relative %: 76 %
Platelets: 238 10*3/uL (ref 150–400)
RBC: 4.55 MIL/uL (ref 4.22–5.81)
RDW: 12.2 % (ref 11.5–15.5)
WBC: 14.9 10*3/uL — ABNORMAL HIGH (ref 4.0–10.5)
nRBC: 0 % (ref 0.0–0.2)

## 2023-04-29 LAB — RAPID URINE DRUG SCREEN, HOSP PERFORMED
Amphetamines: POSITIVE — AB
Barbiturates: NOT DETECTED
Benzodiazepines: NOT DETECTED
Cocaine: NOT DETECTED
Opiates: NOT DETECTED
Tetrahydrocannabinol: POSITIVE — AB

## 2023-04-29 LAB — ETHANOL: Alcohol, Ethyl (B): <10 mg/dL (ref <10)

## 2023-04-29 LAB — ACETAMINOPHEN LEVEL: Acetaminophen (Tylenol), Serum: <10 ug/mL — ABNORMAL LOW (ref 10–30)

## 2023-04-29 LAB — CBG MONITORING, ED: Glucose-Capillary: 88 mg/dL (ref 70–99)

## 2023-04-29 LAB — SALICYLATE LEVEL: Salicylate Lvl: <7 mg/dL — ABNORMAL LOW (ref 7.0–30.0)

## 2023-04-29 MED ORDER — NALOXONE HCL 0.4 MG/ML IJ SOLN
0.4000 mg | Freq: Once | INTRAMUSCULAR | Status: AC
  Administered 2023-04-29: 0.4 mg via INTRAVENOUS

## 2023-04-29 MED ORDER — KETOROLAC TROMETHAMINE 30 MG/ML IJ SOLN
30.0000 mg | Freq: Once | INTRAMUSCULAR | Status: AC
  Administered 2023-04-29: 30 mg via INTRAVENOUS
  Filled 2023-04-29: qty 1

## 2023-04-29 MED ORDER — TETANUS-DIPHTH-ACELL PERTUSSIS 5-2.5-18.5 LF-MCG/0.5 IM SUSY
0.5000 mL | PREFILLED_SYRINGE | Freq: Once | INTRAMUSCULAR | Status: AC
  Administered 2023-04-29: 0.5 mL via INTRAMUSCULAR
  Filled 2023-04-29: qty 0.5

## 2023-04-29 MED ORDER — SODIUM CHLORIDE 0.9 % IV SOLN: INTRAVENOUS | Status: DC

## 2023-04-29 MED ORDER — SODIUM CHLORIDE 0.9 % IV BOLUS
1000.0000 mL | Freq: Once | INTRAVENOUS | Status: AC
  Administered 2023-04-29: 1000 mL via INTRAVENOUS

## 2023-04-29 MED ORDER — IOHEXOL 300 MG/ML  SOLN
100.0000 mL | Freq: Once | INTRAMUSCULAR | Status: AC | PRN
  Administered 2023-04-29: 100 mL via INTRAVENOUS

## 2023-04-29 MED ORDER — SULFAMETHOXAZOLE-TRIMETHOPRIM 800-160 MG PO TABS: 1.0000 | ORAL_TABLET | Freq: Two times a day (BID) | ORAL | 0 refills | Status: AC

## 2023-04-29 MED ORDER — DICLOFENAC SODIUM ER 100 MG PO TB24: 100.0000 mg | ORAL_TABLET | Freq: Every day | ORAL | 0 refills | Status: DC

## 2023-04-29 MED ORDER — NALOXONE HCL 2 MG/2ML IJ SOSY
PREFILLED_SYRINGE | INTRAMUSCULAR | Status: AC
  Filled 2023-04-29: qty 2

## 2023-04-29 MED ORDER — CEFAZOLIN SODIUM-DEXTROSE 2-4 GM/100ML-% IV SOLN
2.0000 g | Freq: Once | INTRAVENOUS | Status: AC
  Administered 2023-04-29: 2 g via INTRAVENOUS
  Filled 2023-04-29: qty 100

## 2023-04-29 NOTE — ED Notes (Signed)
Cervical collar placed
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
## 2023-04-29 NOTE — ED Notes (Addendum)
Pt is refusing to speak to registration.

## 2023-09-24 IMAGING — CR DG KNEE COMPLETE 4+V*L*
4 series · 4 of 4 positions shown · non-contrast
Comparison: None.

CLINICAL DATA: Left knee pain and swelling. Redness and hot to
touch.

EXAM:
LEFT KNEE - COMPLETE 4+ VIEW

[t knee ap left]
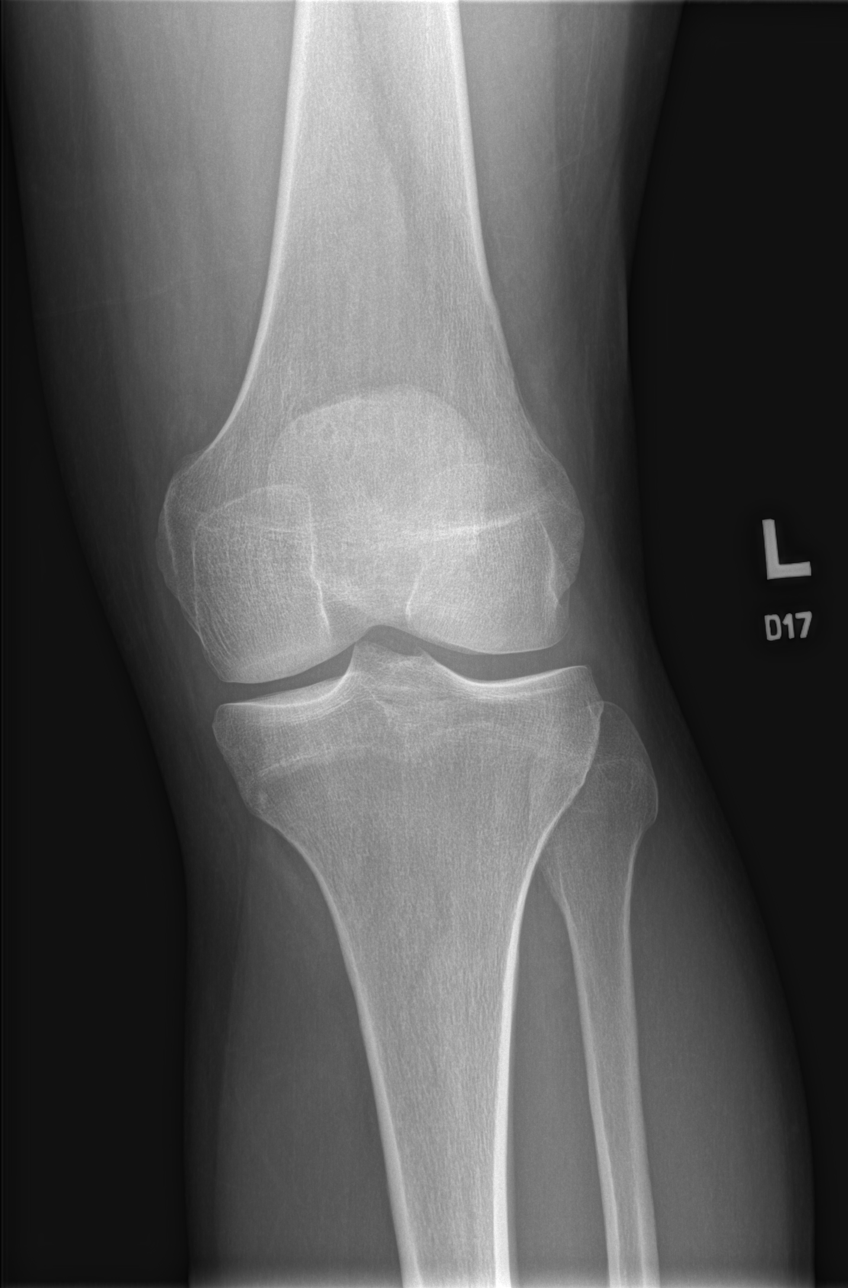

[t knee obl left (1 of 2)]
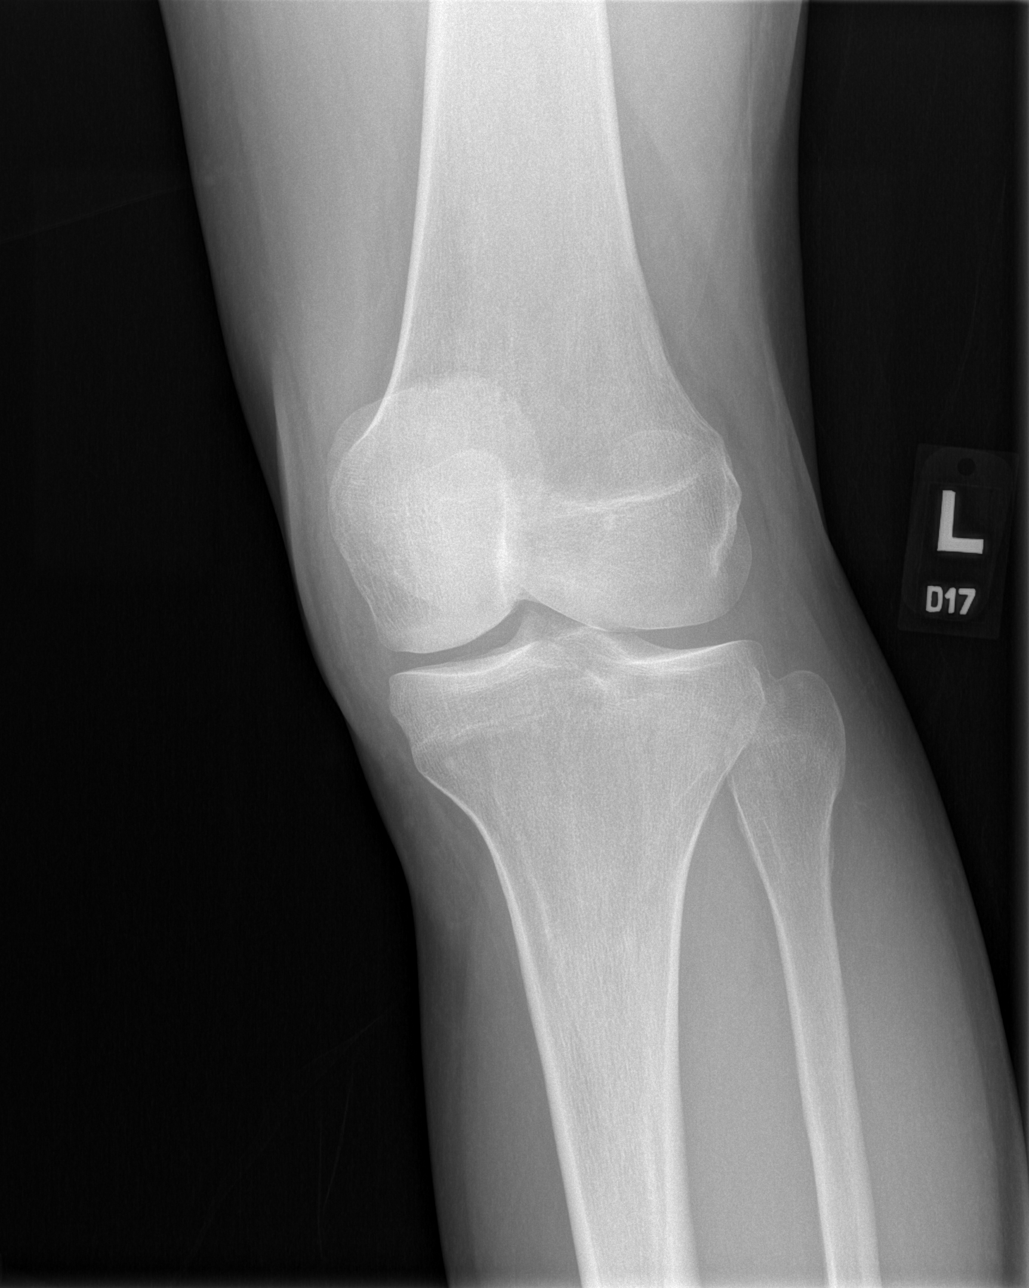

[t knee obl left (2 of 2)]
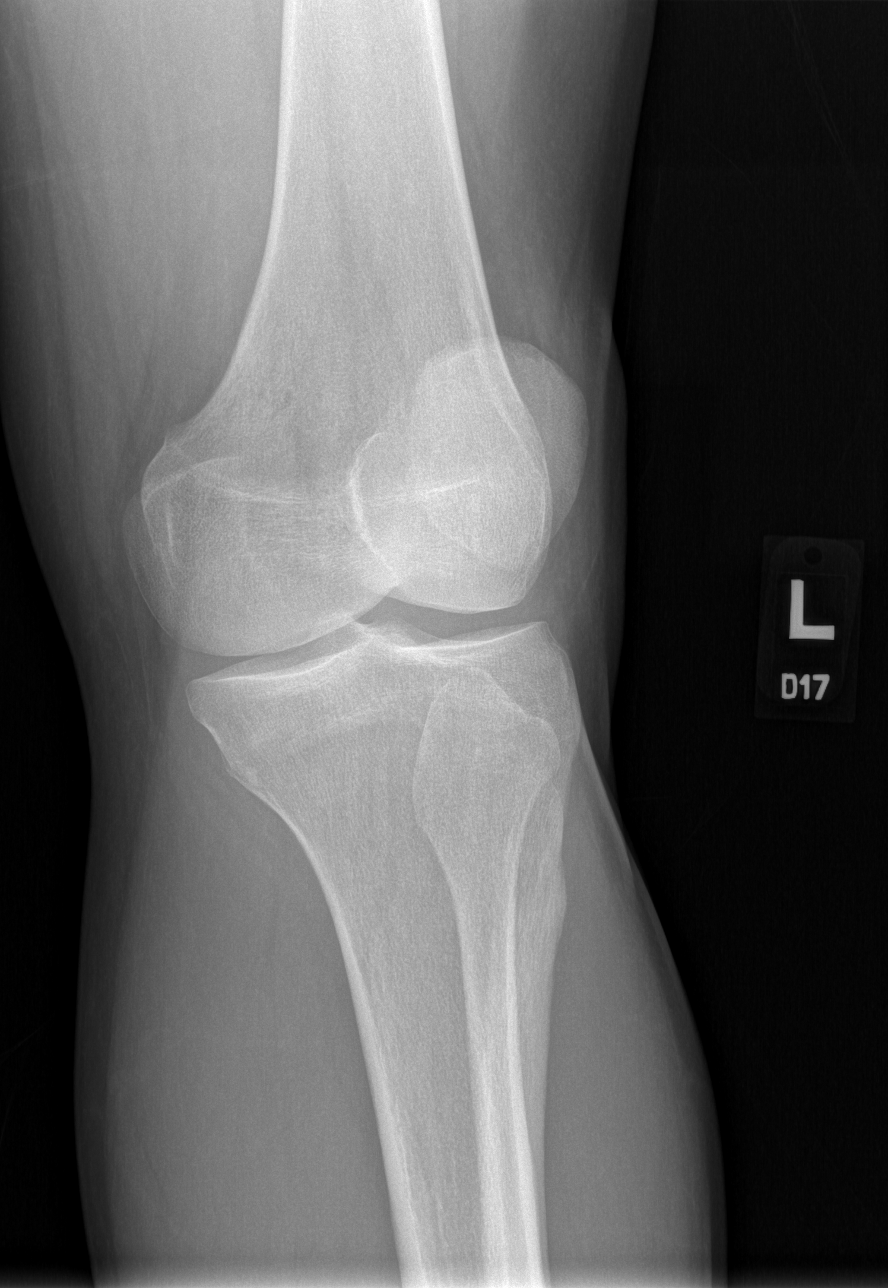

[t knee lat left]
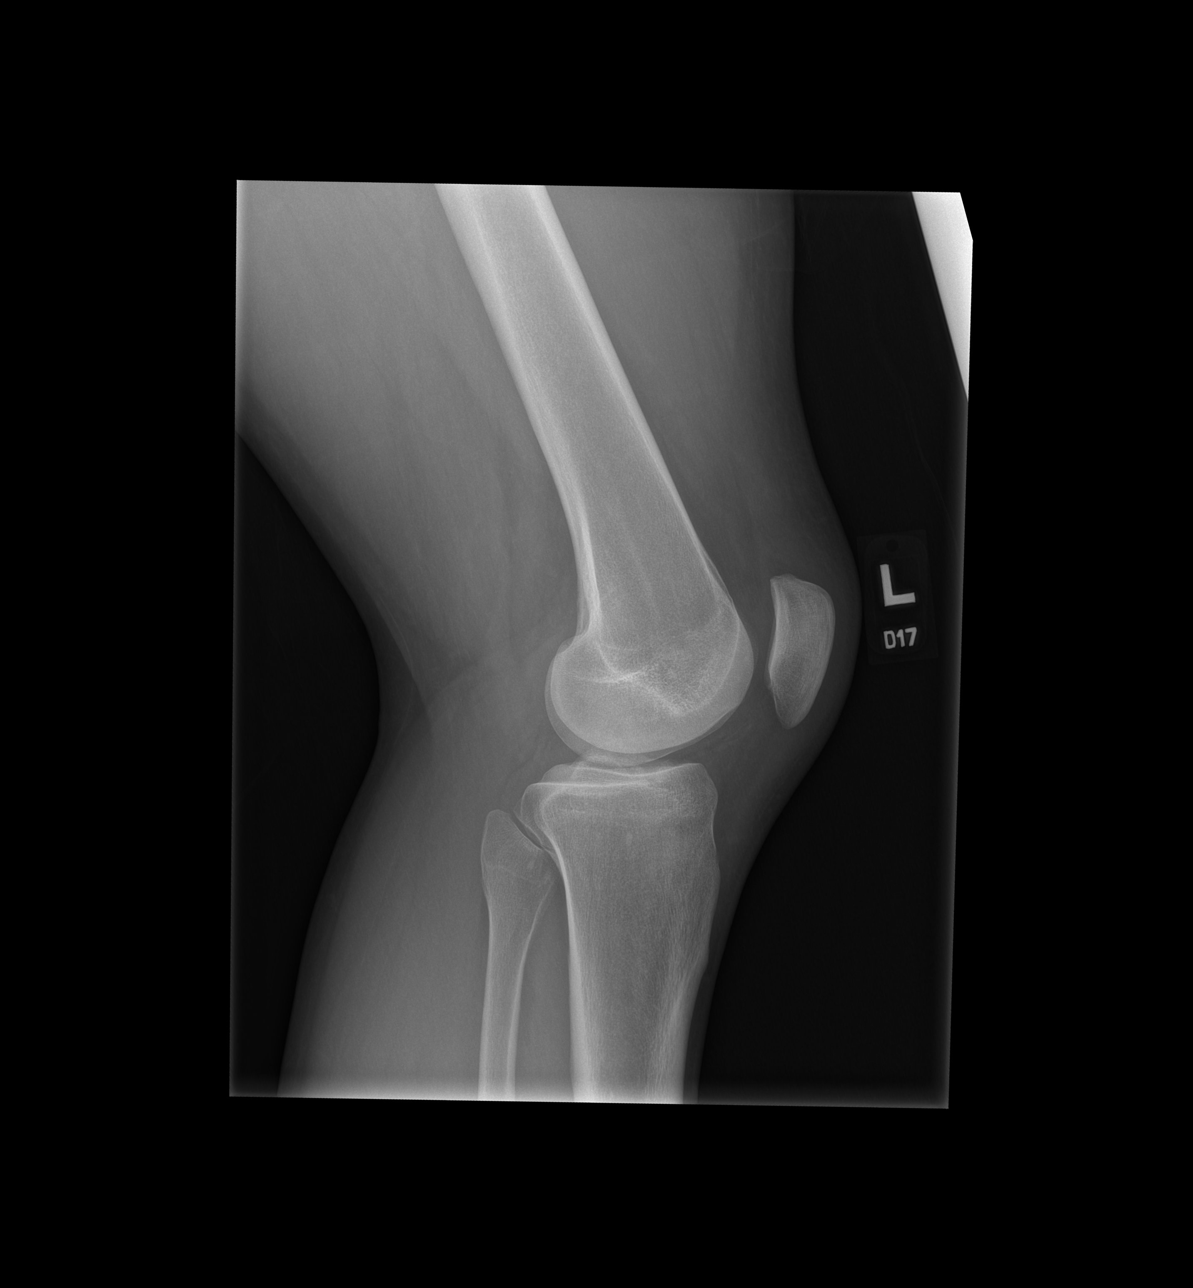

[4 of 4 positions shown; findings below may reference images not displayed]

FINDINGS: Normal bone mineralization. Joint spaces are preserved. No joint
effusion. No acute fracture is seen. No dislocation.
IMPRESSION: Normal left knee radiographs.
